# Patient Record
Sex: Female | Born: 1955 | Race: White | Hispanic: No | Marital: Married | Smoking: Never smoker
Health system: Southern US, Community
[De-identification: ages and names within clinical notes are randomized; demographics above are authoritative.]

## PROBLEM LIST (undated history)

## (undated) HISTORY — PX: ABDOMINAL HYSTERECTOMY: SHX81

## (undated) HISTORY — PX: CHOLECYSTECTOMY: SHX55

---

## 2011-02-07 ENCOUNTER — Emergency Department (HOSPITAL_BASED_OUTPATIENT_CLINIC_OR_DEPARTMENT_OTHER)
Admission: EM | Admit: 2011-02-07 | Discharge: 2011-02-08 | Disposition: A | Discharge status: Home / Self Care | Payer: 59 | Attending: Emergency Medicine | Admitting: Emergency Medicine

## 2011-02-07 ENCOUNTER — Emergency Department (INDEPENDENT_AMBULATORY_CARE_PROVIDER_SITE_OTHER): Payer: 59

## 2011-02-07 DIAGNOSIS — M25429 Effusion, unspecified elbow: Secondary | ICD-10-CM

## 2011-02-07 DIAGNOSIS — W19XXXA Unspecified fall, initial encounter: Secondary | ICD-10-CM

## 2011-02-07 DIAGNOSIS — S52123A Displaced fracture of head of unspecified radius, initial encounter for closed fracture: Secondary | ICD-10-CM

## 2011-02-07 DIAGNOSIS — Y9239 Other specified sports and athletic area as the place of occurrence of the external cause: Secondary | ICD-10-CM | POA: Insufficient documentation

## 2011-02-07 DIAGNOSIS — Y9351 Activity, roller skating (inline) and skateboarding: Secondary | ICD-10-CM | POA: Insufficient documentation

## 2011-02-07 NOTE — ED Notes (Signed)
Pt c/o R arm pain following fall while skating today. Pt states fall occurred around 2pm.  Pt states arm is sore and fingers are beginning to swell and increased pain with movement.

## 2011-02-08 MED ORDER — OXYCODONE-ACETAMINOPHEN 5-325 MG PO TABS: 1.0000 | ORAL_TABLET | Freq: Four times a day (QID) | ORAL | Status: AC | PRN

## 2011-02-08 NOTE — ED Provider Notes (Signed)
History     CSN: [id number]  Arrival date & time 02/07/11  2050   First MD Initiated Contact with Patient 02/07/11 2308      Chief Complaint  Patient presents with  . Arm Injury    (Consider location/radiation/quality/duration/timing/severity/associated sxs/prior treatment) Patient is a 55 y.o. female presenting with arm injury. The history is provided by the patient.  Arm Injury  The incident occurred today. The injury mechanism was a fall. Pertinent negatives include no chest pain, no numbness, no abdominal pain, no headaches and no weakness.   patient fell while rollerskating at a roller in today. She states she landed on her right arm. She has pain in her right arm states she cannot move it. No numbness. No other injury. No loss of consciousness. States she's otherwise healthy.  History reviewed. No pertinent past medical history.  Past Surgical History  Procedure Date  . Abdominal hysterectomy   . Cholecystectomy     No family history on file.  History  Substance Use Topics  . Smoking status: Never Smoker   . Smokeless tobacco: Not on file  . Alcohol Use: No    OB History    Grav Para Term Preterm Abortions TAB SAB Ect Mult Living                  Review of Systems  Respiratory: Negative for shortness of breath.   Cardiovascular: Negative for chest pain.  Gastrointestinal: Negative for abdominal pain.  Musculoskeletal: Negative for back pain.       Right upper extremity pain  Skin: Negative for pallor and rash.  Neurological: Negative for weakness, numbness and headaches.    Allergies  Review of patient's allergies indicates no known allergies.  Home Medications   Current Outpatient Rx  Name Route Sig Dispense Refill  . MOMETASONE FUROATE 50 MCG/ACT NA SUSP Nasal Place 2 sprays into the nose daily as needed. For allergies     . OXYCODONE-ACETAMINOPHEN 5-325 MG PO TABS Oral Take 1-2 tablets by mouth every 6 (six) hours as needed for pain. 20 tablet 0      BP 159/91  Pulse 91  Temp(Src) 98.1 F (36.7 C) (Oral)  Resp 16  Ht 5' 0.5" (1.537 m)  Wt 205 lb (92.987 kg)  BMI 39.38 kg/m2  SpO2 98%  Physical Exam  Constitutional: She appears well-developed and well-nourished.  HENT:  Head: Normocephalic.  Neck: Normal range of motion. Neck supple.  Cardiovascular: Normal rate.   Musculoskeletal: She exhibits tenderness.       Tenderness to right elbow over radial head. Decreased range of motion at elbow. Neurovascular intact over hand and wrist. No tenderness over shoulder.  Neurological: She is alert.  Skin: Skin is warm. No rash noted. No erythema.    ED Course  Procedures (including critical care time)  Labs Reviewed - No data to display Dg Elbow Complete Right  02/07/2011  *RADIOLOGY REPORT*  Clinical Data: Fall  RIGHT ELBOW - COMPLETE 3+ VIEW  Comparison: None.  Findings: There is a joint effusion present.  A fracture deformity involves the lateral aspect of the radial head.  There is no dislocation identified.  IMPRESSION:  1.  Radial head fracture. 2.  Joint effusion.  Original Report Authenticated By: Rosealee Albee, M.D.     1. Radial head fracture       MDM  Fall with radial head fracture. Immobilized in sling. She will follow with the hand surgeon Dr. Merlyn Lot  Juliet Rude. Rubin Payor, MD 02/08/11 [phone]

## 2011-02-08 NOTE — ED Notes (Signed)
Sling placed on the right arm; patient given instructions for use and removal.

## 2016-11-22 ENCOUNTER — Emergency Department (HOSPITAL_COMMUNITY): Payer: Managed Care, Other (non HMO)

## 2016-11-22 ENCOUNTER — Observation Stay (HOSPITAL_COMMUNITY)
Admission: EM | Admit: 2016-11-22 | Discharge: 2016-11-23 | Disposition: A | Discharge status: Home / Self Care | Payer: Managed Care, Other (non HMO) | Attending: General Surgery | Admitting: General Surgery

## 2016-11-22 ENCOUNTER — Encounter (HOSPITAL_COMMUNITY): Payer: Self-pay

## 2016-11-22 DIAGNOSIS — I10 Essential (primary) hypertension: Secondary | ICD-10-CM | POA: Insufficient documentation

## 2016-11-22 DIAGNOSIS — Z23 Encounter for immunization: Secondary | ICD-10-CM | POA: Diagnosis not present

## 2016-11-22 DIAGNOSIS — Y92412 Parkway as the place of occurrence of the external cause: Secondary | ICD-10-CM | POA: Insufficient documentation

## 2016-11-22 DIAGNOSIS — R51 Headache: Secondary | ICD-10-CM | POA: Insufficient documentation

## 2016-11-22 DIAGNOSIS — S0101XA Laceration without foreign body of scalp, initial encounter: Secondary | ICD-10-CM | POA: Diagnosis present

## 2016-11-22 DIAGNOSIS — Z90710 Acquired absence of both cervix and uterus: Secondary | ICD-10-CM | POA: Diagnosis not present

## 2016-11-22 DIAGNOSIS — S0181XA Laceration without foreign body of other part of head, initial encounter: Secondary | ICD-10-CM | POA: Insufficient documentation

## 2016-11-22 DIAGNOSIS — R918 Other nonspecific abnormal finding of lung field: Secondary | ICD-10-CM | POA: Diagnosis not present

## 2016-11-22 DIAGNOSIS — Z79899 Other long term (current) drug therapy: Secondary | ICD-10-CM | POA: Diagnosis not present

## 2016-11-22 DIAGNOSIS — D62 Acute posthemorrhagic anemia: Secondary | ICD-10-CM | POA: Diagnosis not present

## 2016-11-22 DIAGNOSIS — R0789 Other chest pain: Secondary | ICD-10-CM | POA: Insufficient documentation

## 2016-11-22 DIAGNOSIS — E785 Hyperlipidemia, unspecified: Secondary | ICD-10-CM | POA: Insufficient documentation

## 2016-11-22 DIAGNOSIS — E872 Acidosis: Secondary | ICD-10-CM | POA: Diagnosis not present

## 2016-11-22 LAB — URINALYSIS, ROUTINE W REFLEX MICROSCOPIC
Bilirubin Urine: NEGATIVE
Glucose, UA: NEGATIVE mg/dL
Ketones, ur: 5 mg/dL — AB
Nitrite: NEGATIVE
Protein, ur: 100 mg/dL — AB
Specific Gravity, Urine: 1.023 (ref 1.005–1.030)
pH: 5 (ref 5.0–8.0)

## 2016-11-22 LAB — CBC
HCT: 43.4 % (ref 36.0–46.0)
HEMOGLOBIN: 14.1 g/dL (ref 12.0–15.0)
MCH: 29.2 pg (ref 26.0–34.0)
MCHC: 32.5 g/dL (ref 30.0–36.0)
MCV: 89.9 fL (ref 78.0–100.0)
Platelets: 454 10*3/uL — ABNORMAL HIGH (ref 150–400)
RBC: 4.83 MIL/uL (ref 3.87–5.11)
RDW: 13.4 % (ref 11.5–15.5)
WBC: 21 10*3/uL — AB (ref 4.0–10.5)

## 2016-11-22 LAB — COMPREHENSIVE METABOLIC PANEL
ALBUMIN: 4.1 g/dL (ref 3.5–5.0)
ALT: 47 U/L (ref 14–54)
ANION GAP: 12 (ref 5–15)
AST: 48 U/L — ABNORMAL HIGH (ref 15–41)
Alkaline Phosphatase: 80 U/L (ref 38–126)
BILIRUBIN TOTAL: 1.2 mg/dL (ref 0.3–1.2)
BUN: 11 mg/dL (ref 6–20)
CHLORIDE: 107 mmol/L (ref 101–111)
CO2: 19 mmol/L — ABNORMAL LOW (ref 22–32)
Calcium: 9.2 mg/dL (ref 8.9–10.3)
Creatinine, Ser: 0.73 mg/dL (ref 0.44–1.00)
GFR calc Af Amer: >60 mL/min (ref >60)
GFR calc non Af Amer: >60 mL/min (ref >60)
GLUCOSE: 106 mg/dL — AB (ref 65–99)
POTASSIUM: 3.5 mmol/L (ref 3.5–5.1)
Sodium: 138 mmol/L (ref 135–145)
Total Protein: 7.3 g/dL (ref 6.5–8.1)

## 2016-11-22 LAB — RAPID URINE DRUG SCREEN, HOSP PERFORMED
Amphetamines: NOT DETECTED
Barbiturates: NOT DETECTED
Benzodiazepines: NOT DETECTED
Cocaine: NOT DETECTED
Opiates: NOT DETECTED
Tetrahydrocannabinol: NOT DETECTED

## 2016-11-22 LAB — I-STAT CG4 LACTIC ACID, ED: LACTIC ACID, VENOUS: 2.83 mmol/L — AB (ref 0.5–1.9)

## 2016-11-22 LAB — ETHANOL: Alcohol, Ethyl (B): <10 mg/dL (ref <10)

## 2016-11-22 LAB — PROTIME-INR
INR: 0.94
Prothrombin Time: 12.5 seconds (ref 11.4–15.2)

## 2016-11-22 MED ORDER — MONTELUKAST SODIUM 10 MG PO TABS
10.0000 mg | ORAL_TABLET | Freq: Every day | ORAL | Status: DC
  Filled 2016-11-22: qty 1

## 2016-11-22 MED ORDER — CEFAZOLIN SODIUM-DEXTROSE 2-4 GM/100ML-% IV SOLN
2.0000 g | Freq: Once | INTRAVENOUS | Status: AC
  Administered 2016-11-23: 2 g via INTRAVENOUS
  Filled 2016-11-22: qty 100

## 2016-11-22 MED ORDER — CEFAZOLIN SODIUM-DEXTROSE 2-4 GM/100ML-% IV SOLN: 2.0000 g | Freq: Once | INTRAVENOUS | Status: DC

## 2016-11-22 MED ORDER — POTASSIUM CHLORIDE IN NACL 20-0.9 MEQ/L-% IV SOLN
INTRAVENOUS | Status: DC
  Administered 2016-11-22: 23:00:00 via INTRAVENOUS
  Filled 2016-11-22: qty 1000

## 2016-11-22 MED ORDER — ONDANSETRON 4 MG PO TBDP: 4.0000 mg | ORAL_TABLET | Freq: Four times a day (QID) | ORAL | Status: DC | PRN

## 2016-11-22 MED ORDER — CEFAZOLIN SODIUM-DEXTROSE 2-4 GM/100ML-% IV SOLN
2.0000 g | Freq: Once | INTRAVENOUS | Status: AC
  Administered 2016-11-22: 2 g via INTRAVENOUS
  Filled 2016-11-22: qty 100

## 2016-11-22 MED ORDER — ATORVASTATIN CALCIUM 20 MG PO TABS
20.0000 mg | ORAL_TABLET | Freq: Every day | ORAL | Status: DC
  Administered 2016-11-22: 20 mg via ORAL
  Filled 2016-11-22: qty 1

## 2016-11-22 MED ORDER — IOPAMIDOL (ISOVUE-300) INJECTION 61%
INTRAVENOUS | Status: AC
  Administered 2016-11-22: 100 mL
  Filled 2016-11-22: qty 100

## 2016-11-22 MED ORDER — MIDAZOLAM HCL 2 MG/2ML IJ SOLN
2.0000 mg | Freq: Once | INTRAMUSCULAR | Status: AC
  Administered 2016-11-22: 2 mg via INTRAVENOUS

## 2016-11-22 MED ORDER — LIDOCAINE HCL (PF) 1 % IJ SOLN
30.0000 mL | Freq: Once | INTRAMUSCULAR | Status: DC
  Filled 2016-11-22: qty 30

## 2016-11-22 MED ORDER — TETANUS-DIPHTH-ACELL PERTUSSIS 5-2.5-18.5 LF-MCG/0.5 IM SUSP
0.5000 mL | Freq: Once | INTRAMUSCULAR | Status: AC
  Administered 2016-11-22: 0.5 mL via INTRAMUSCULAR
  Filled 2016-11-22: qty 0.5

## 2016-11-22 MED ORDER — MIDAZOLAM HCL 2 MG/2ML IJ SOLN
INTRAMUSCULAR | Status: AC
  Administered 2016-11-22: 2 mg via INTRAVENOUS
  Filled 2016-11-22: qty 2

## 2016-11-22 MED ORDER — ENOXAPARIN SODIUM 40 MG/0.4ML ~~LOC~~ SOLN
40.0000 mg | SUBCUTANEOUS | Status: DC
Start: 2016-11-23 — End: 2016-11-23
  Filled 2016-11-22: qty 0.4

## 2016-11-22 MED ORDER — PROCHLORPERAZINE EDISYLATE 5 MG/ML IJ SOLN: 5.0000 mg | Freq: Four times a day (QID) | INTRAMUSCULAR | Status: DC | PRN

## 2016-11-22 MED ORDER — LISINOPRIL 10 MG PO TABS
10.0000 mg | ORAL_TABLET | Freq: Every day | ORAL | Status: DC
  Administered 2016-11-22: 10 mg via ORAL
  Filled 2016-11-22: qty 1

## 2016-11-22 MED ORDER — PROCHLORPERAZINE MALEATE 10 MG PO TABS
10.0000 mg | ORAL_TABLET | Freq: Four times a day (QID) | ORAL | Status: DC | PRN
  Filled 2016-11-22: qty 1

## 2016-11-22 MED ORDER — ONDANSETRON HCL 4 MG/2ML IJ SOLN: 4.0000 mg | Freq: Four times a day (QID) | INTRAMUSCULAR | Status: DC | PRN

## 2016-11-22 MED ORDER — SODIUM CHLORIDE 0.9 % IV BOLUS (SEPSIS)
500.0000 mL | Freq: Once | INTRAVENOUS | Status: AC
  Administered 2016-11-22: 500 mL via INTRAVENOUS

## 2016-11-22 MED ORDER — TRAMADOL HCL 50 MG PO TABS: 50.0000 mg | ORAL_TABLET | Freq: Four times a day (QID) | ORAL | Status: DC | PRN

## 2016-11-22 MED ORDER — FENTANYL CITRATE (PF) 100 MCG/2ML IJ SOLN
INTRAMUSCULAR | Status: AC
  Filled 2016-11-22: qty 2

## 2016-11-22 MED ORDER — ACETAMINOPHEN 500 MG PO TABS
1000.0000 mg | ORAL_TABLET | Freq: Three times a day (TID) | ORAL | Status: DC
  Administered 2016-11-22 – 2016-11-23 (×2): 1000 mg via ORAL
  Filled 2016-11-22 (×2): qty 2

## 2016-11-22 MED ORDER — HYDROMORPHONE HCL 1 MG/ML IJ SOLN: 0.5000 mg | INTRAMUSCULAR | Status: DC | PRN

## 2016-11-22 MED ORDER — OXYCODONE HCL 5 MG PO TABS
5.0000 mg | ORAL_TABLET | ORAL | Status: DC | PRN
  Administered 2016-11-22 – 2016-11-23 (×3): 5 mg via ORAL
  Filled 2016-11-22 (×3): qty 1

## 2016-11-22 MED ORDER — FENTANYL CITRATE (PF) 100 MCG/2ML IJ SOLN
50.0000 ug | Freq: Once | INTRAMUSCULAR | Status: AC
  Administered 2016-11-22: 50 ug via INTRAVENOUS

## 2016-11-22 MED ORDER — FENTANYL CITRATE (PF) 100 MCG/2ML IJ SOLN
50.0000 ug | Freq: Once | INTRAMUSCULAR | Status: AC
  Administered 2016-11-22: 50 ug via INTRAVENOUS
  Filled 2016-11-22: qty 2

## 2016-11-22 NOTE — ED Notes (Signed)
Lactic acid result given to Dr. Rubin Payor
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
## 2016-11-22 NOTE — Procedures (Signed)
25 cm right temporal full thickness, complex laceration irrigated, debrided and repaired with multiple layers using Vicryl, Prolene, and Staples.  Minimal blood loss.  Before repair    After Repair  Marta Lamas. Gae Bon, MD, FACS [phone] Trauma Surgeon

## 2016-11-22 NOTE — ED Notes (Signed)
Patient is stable and ready to be transport to the floor at this time.  Report was called to 6N RN.  Belongings taken with the patient to the floor.   
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
## 2016-11-23 DIAGNOSIS — S0101XA Laceration without foreign body of scalp, initial encounter: Secondary | ICD-10-CM | POA: Diagnosis not present

## 2016-11-23 LAB — CBC
HEMATOCRIT: 42.2 % (ref 36.0–46.0)
HEMATOCRIT: 38.1 % (ref 36.0–46.0)
HEMOGLOBIN: 13.5 g/dL (ref 12.0–15.0)
HEMOGLOBIN: 12 g/dL (ref 12.0–15.0)
MCH: 28.8 pg (ref 26.0–34.0)
MCH: 28.4 pg (ref 26.0–34.0)
MCHC: 32 g/dL (ref 30.0–36.0)
MCHC: 31.5 g/dL (ref 30.0–36.0)
MCV: 90 fL (ref 78.0–100.0)
MCV: 90.3 fL (ref 78.0–100.0)
Platelets: 454 10*3/uL — ABNORMAL HIGH (ref 150–400)
Platelets: 396 10*3/uL (ref 150–400)
RBC: 4.69 MIL/uL (ref 3.87–5.11)
RBC: 4.22 MIL/uL (ref 3.87–5.11)
RDW: 13.5 % (ref 11.5–15.5)
RDW: 14.1 % (ref 11.5–15.5)
WBC: 18.4 10*3/uL — AB (ref 4.0–10.5)
WBC: 12.8 10*3/uL — AB (ref 4.0–10.5)

## 2016-11-23 LAB — CREATININE, SERUM: Creatinine, Ser: 0.79 mg/dL (ref 0.44–1.00)

## 2016-11-23 NOTE — Discharge Instructions (Signed)
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
## 2020-08-25 ENCOUNTER — Ambulatory Visit: Payer: Self-pay
